# Patient Record
Sex: Male | Born: 2000 | Race: Black or African American | Hispanic: No | Marital: Single | Smoking: Never smoker
Health system: Southern US, Community
[De-identification: ages and names within clinical notes are randomized; demographics above are authoritative.]

---
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
## 2018-11-30 IMAGING — CT CT RENAL STONE PROTOCOL
2 of 3 series · 16 of 46 positions shown, 18 images · non-contrast
Comparison: None.

CLINICAL DATA: Left flank pain, stone disease suspected.

EXAM:
CT ABDOMEN AND PELVIS WITHOUT CONTRAST
TECHNIQUE: Multidetector CT imaging of the abdomen and pelvis was performed
following the standard protocol without IV contrast.

[Series 4: lung · axial · 0.60mm/px · z∈[-176,-56]mm · 13 of 70 slices shown, 15 images]
[im 5/70  soft-tissue]
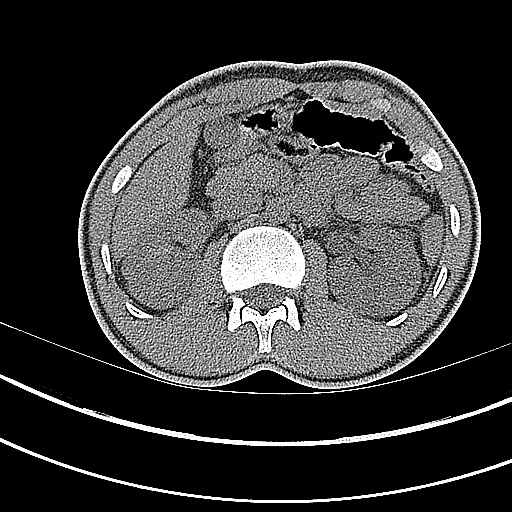
[im 5/70  bone]
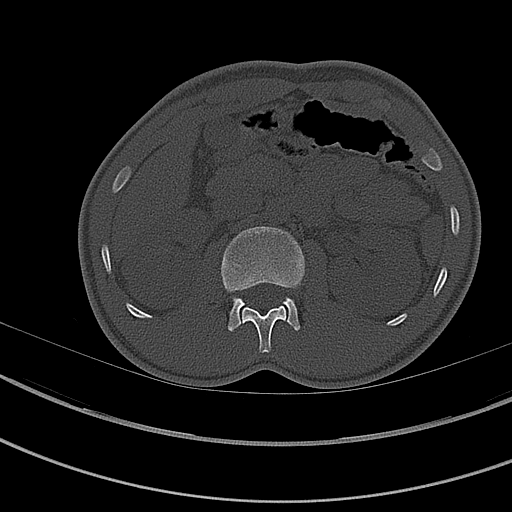
[im 9/70  soft-tissue]
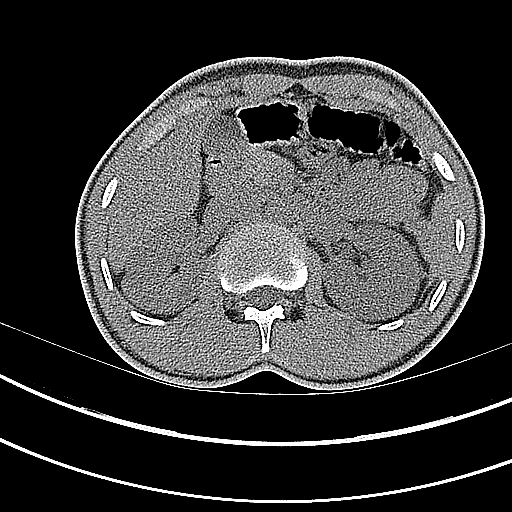
[im 14/70  soft-tissue]
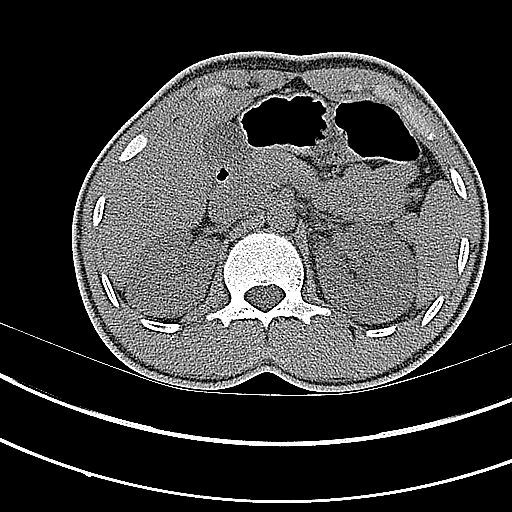
[im 21/70  soft-tissue]
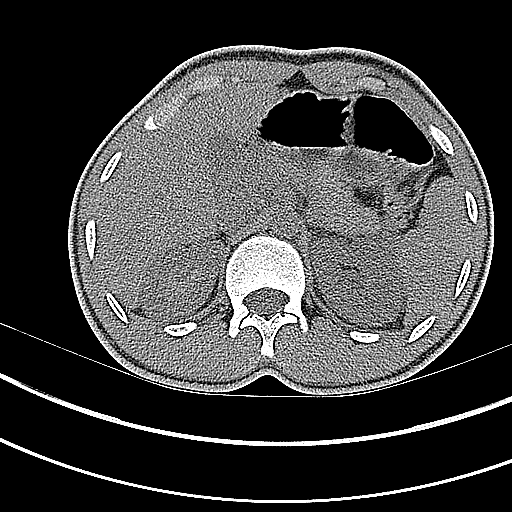
[im 25/70  soft-tissue]
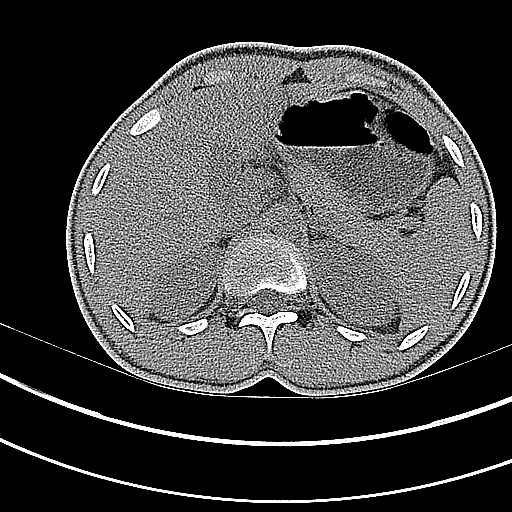
[im 29/70  soft-tissue]
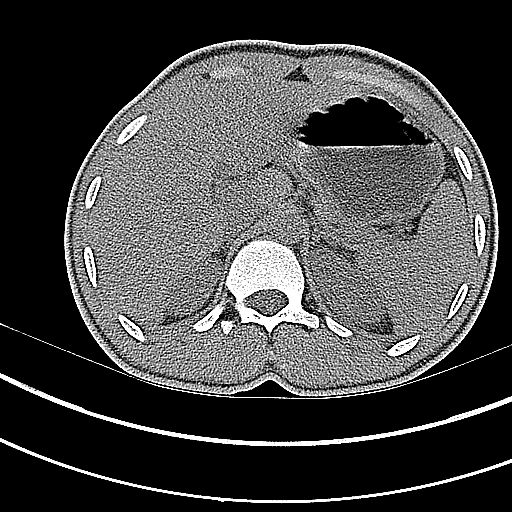
[im 36/70  soft-tissue]
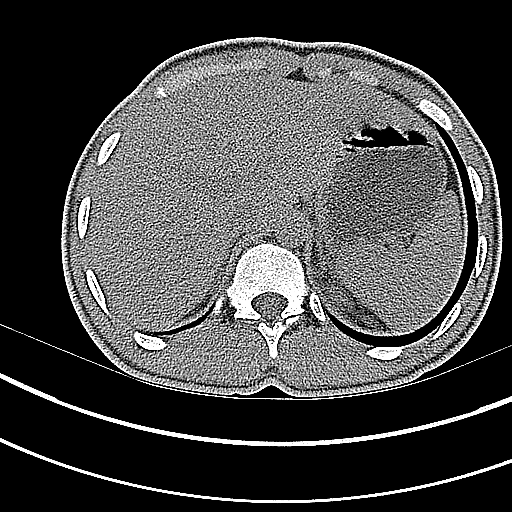
[im 41/70  soft-tissue]
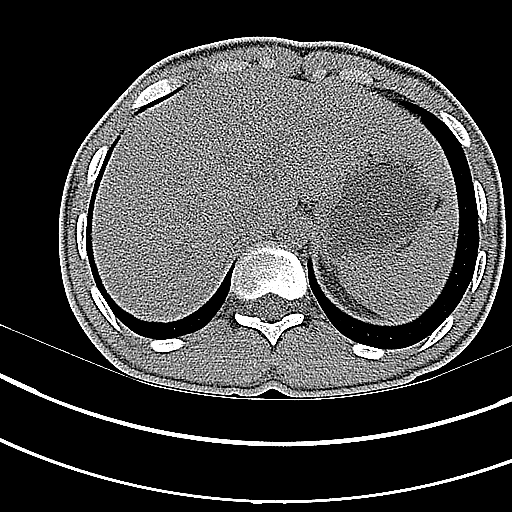
[im 45/70  soft-tissue]
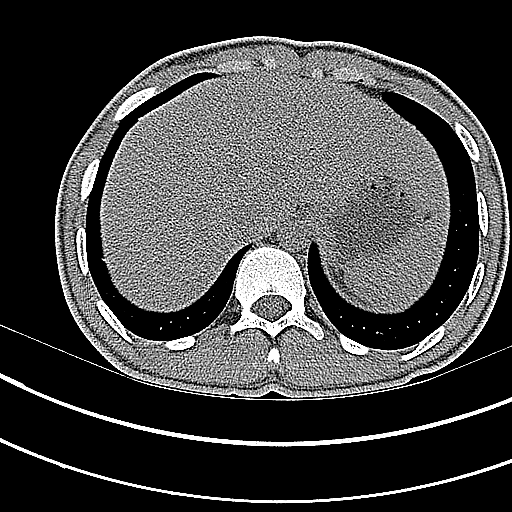
[im 45/70  bone]
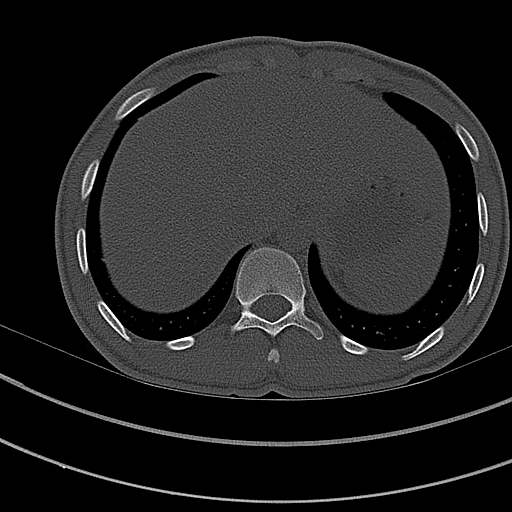
[im 49/70  soft-tissue]
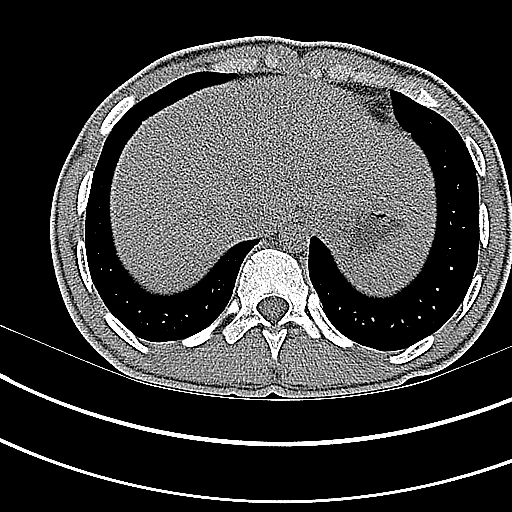
[im 56/70  soft-tissue]
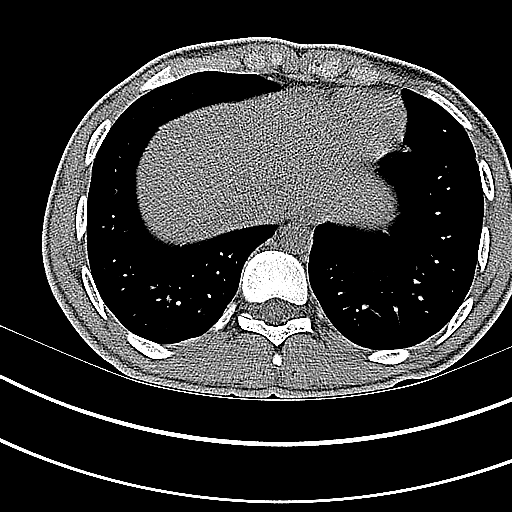
[im 61/70  soft-tissue]
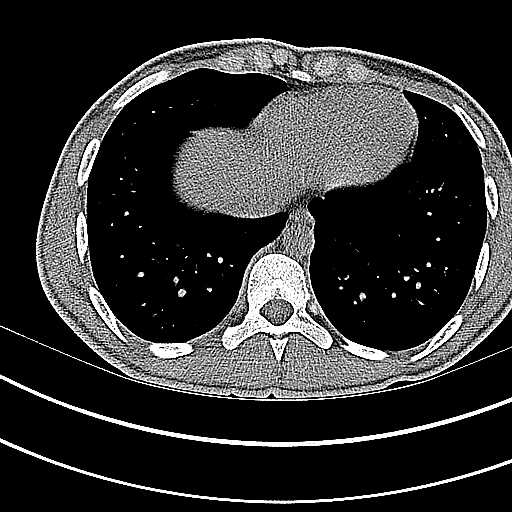
[im 65/70  soft-tissue]
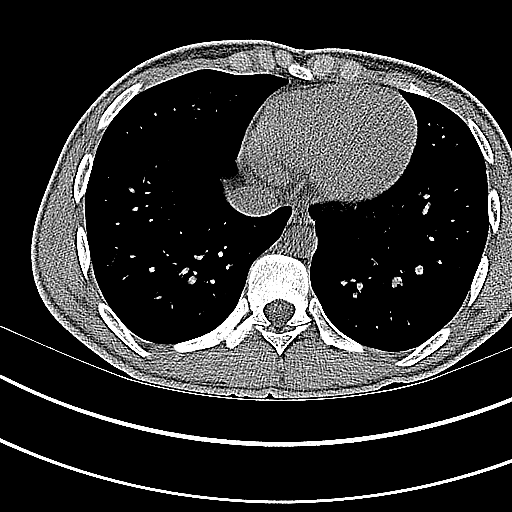

[Series 5: coronal · coronal · 0.59mm/px · 3 of 103 slices shown]
[im 35/103  soft-tissue]
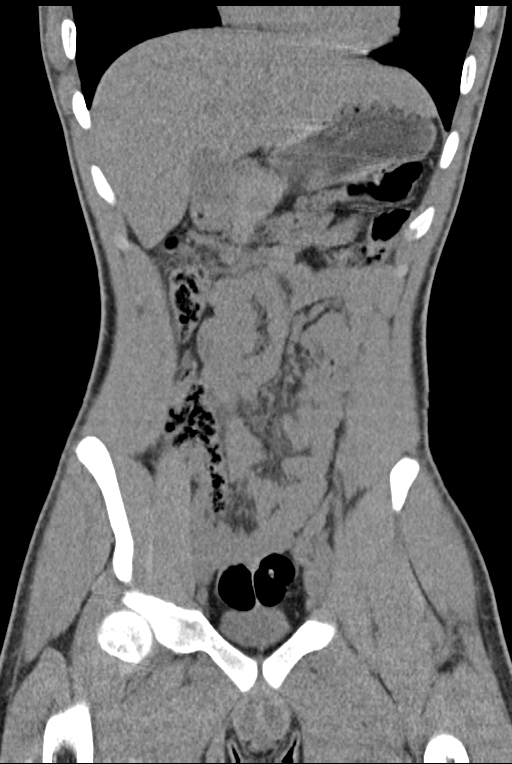
[im 46/103  soft-tissue]
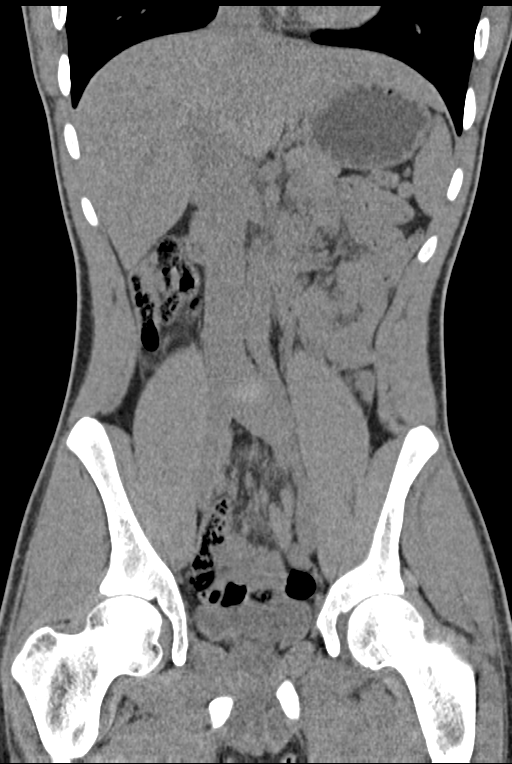
[im 57/103  soft-tissue]
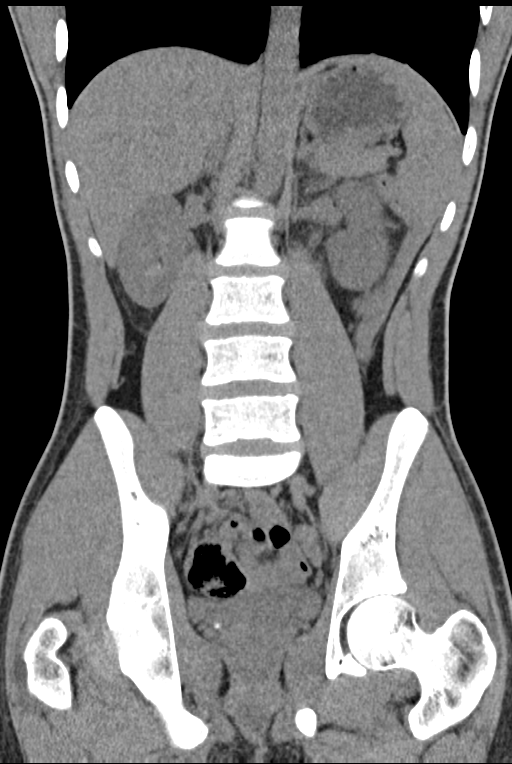

[16 of 46 positions shown; findings below may reference images not displayed]

FINDINGS: Lower chest: The lung bases are clear.

Hepatobiliary: No focal hepatic lesion allowing for lack contrast.
Gallbladder physiologically distended, no calcified stone. No
biliary dilatation.

Pancreas: No ductal dilatation or inflammation.

Spleen: Normal in size without focal abnormality.

Adrenals/Urinary Tract: Normal adrenal glands.

Obstructing 3 mm stone in the left proximal ureter (at the level of
L2-L3) with mild left hydronephrosis and perinephric edema. The more
distal ureter is decompressed.

No right hydronephrosis. The right ureter is decompressed. Right
pelvic calcification adjacent to the urinary bladder is likely a
phlebolith.

Urinary bladder is nondistended and not well evaluated.

Stomach/Bowel: Lack of contrast and paucity of intra-abdominal fat
limits assessment. No evidence of bowel inflammation, obstruction or
wall thickening.

Vascular/Lymphatic: Normal caliber abdominal aorta. No bulky
adenopathy.

Reproductive: Prostate is unremarkable.

Other: No free air or ascites.

Musculoskeletal: There are no acute or suspicious osseous
abnormalities.
IMPRESSION: Obstructing 3 mm stone in the left proximal ureter with mild
hydronephrosis.
